# Patient Record
Sex: Male | Born: 1935 | Marital: Married
Health system: Southern US, Community
[De-identification: ages and names within clinical notes are randomized; demographics above are authoritative.]

---

## 2010-12-13 ENCOUNTER — Ambulatory Visit: Payer: Self-pay | Admitting: Podiatry

## 2010-12-16 ENCOUNTER — Ambulatory Visit: Payer: Self-pay | Admitting: Podiatry

## 2010-12-19 LAB — PATHOLOGY REPORT

## 2011-07-26 ENCOUNTER — Ambulatory Visit: Payer: Self-pay | Admitting: Neurology

## 2011-08-01 ENCOUNTER — Ambulatory Visit: Payer: Self-pay | Admitting: Neurology

## 2011-12-26 ENCOUNTER — Ambulatory Visit: Payer: Self-pay | Admitting: Internal Medicine

## 2011-12-26 LAB — URINALYSIS, COMPLETE
Glucose,UR: NEGATIVE mg/dL (ref 0–75)
Ketone: NEGATIVE
Nitrite: NEGATIVE
Ph: 6 (ref 4.5–8.0)
Protein: 100
RBC,UR: >30 /HPF (ref 0–5)
Specific Gravity: >=1.03 (ref 1.003–1.030)

## 2011-12-28 LAB — URINE CULTURE

## 2013-03-25 ENCOUNTER — Ambulatory Visit: Payer: Self-pay | Admitting: Cardiology

## 2013-03-25 LAB — CBC WITH DIFFERENTIAL/PLATELET
Basophil #: 0.1 10*3/uL (ref 0.0–0.1)
Basophil %: 1 %
Eosinophil #: 0.2 10*3/uL (ref 0.0–0.7)
Eosinophil %: 2.1 %
HCT: 41.8 % (ref 40.0–52.0)
Lymphocyte #: 2.7 10*3/uL (ref 1.0–3.6)
MCH: 30.2 pg (ref 26.0–34.0)
MCHC: 33.6 g/dL (ref 32.0–36.0)
MCV: 90 fL (ref 80–100)
Monocyte #: 1.1 x10 3/mm — ABNORMAL HIGH (ref 0.2–1.0)
Monocyte %: 10.9 %
Neutrophil #: 6 10*3/uL (ref 1.4–6.5)
RDW: 14.9 % — ABNORMAL HIGH (ref 11.5–14.5)
WBC: 10.1 10*3/uL (ref 3.8–10.6)

## 2013-03-25 LAB — BASIC METABOLIC PANEL
Anion Gap: 3 — ABNORMAL LOW (ref 7–16)
BUN: 26 mg/dL — ABNORMAL HIGH (ref 7–18)
EGFR (Non-African Amer.): 54 — ABNORMAL LOW
Osmolality: 286 (ref 275–301)
Potassium: 4.4 mmol/L (ref 3.5–5.1)
Sodium: 140 mmol/L (ref 136–145)

## 2013-03-25 LAB — APTT: Activated PTT: 35.9 secs (ref 23.6–35.9)

## 2013-03-25 LAB — PROTIME-INR
INR: 1.2
Prothrombin Time: 14.9 secs — ABNORMAL HIGH (ref 11.5–14.7)

## 2013-04-01 ENCOUNTER — Ambulatory Visit: Payer: Self-pay | Admitting: Cardiology

## 2013-04-01 IMAGING — CR DG CHEST 1V PORT
1 series · 1 of 1 positions shown · non-contrast
Comparison: [DATE]

CLINICAL DATA: Pacemaker placement

EXAM:
PORTABLE CHEST - 1 VIEW

[ap]
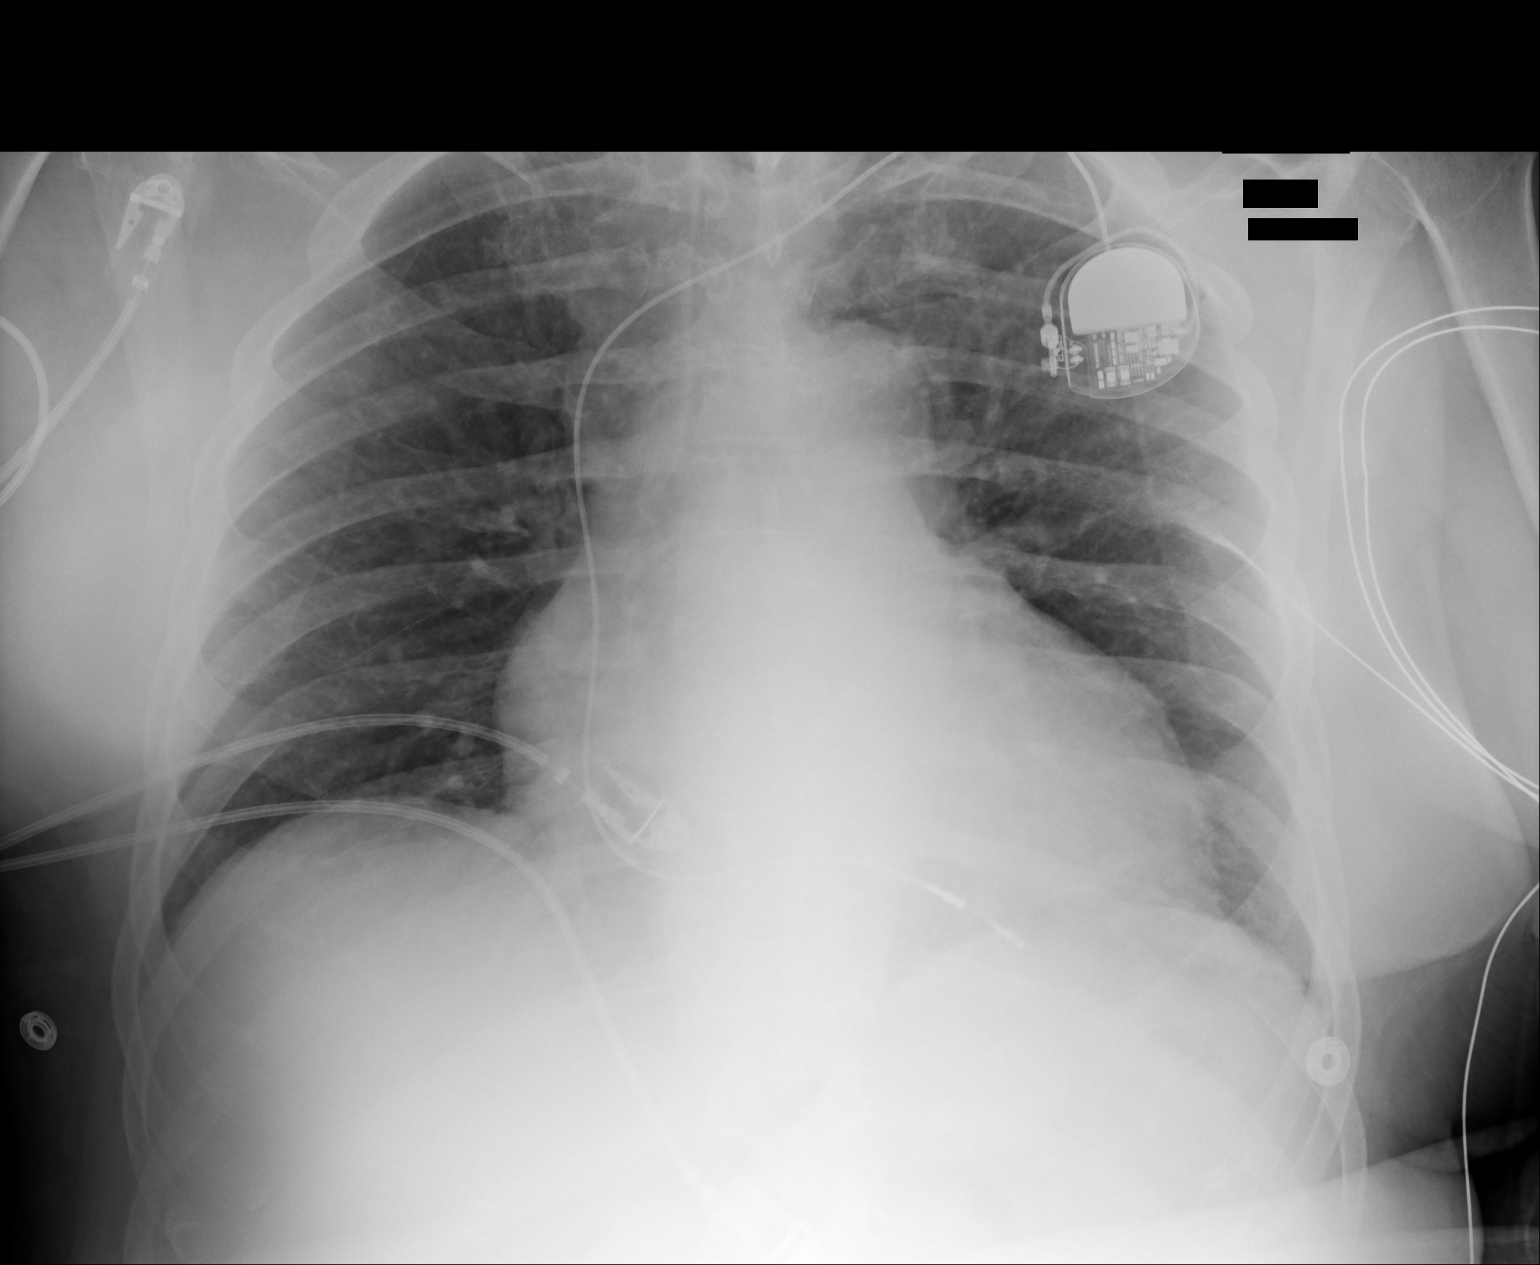

[1 of 1 positions shown; findings below may reference images not displayed]

FINDINGS: Patient is status post cardiac pacemaker placement with single lead
projected over the right ventricle. There is no pneumothorax. The
lungs are clear. The aorta is tortuous. The heart size is enlarged.
The soft tissues and osseous structures are stable.
IMPRESSION: No active disease.  Pacemaker as described.  No pneumothorax.

## 2013-05-16 LAB — URINALYSIS, COMPLETE
Bilirubin,UR: NEGATIVE
Ph: 5 (ref 4.5–8.0)
RBC,UR: 51 /HPF (ref 0–5)
Squamous Epithelial: 1

## 2013-05-16 LAB — COMPREHENSIVE METABOLIC PANEL
Alkaline Phosphatase: 103 U/L
Anion Gap: 5 — ABNORMAL LOW (ref 7–16)
Bilirubin,Total: 0.8 mg/dL (ref 0.2–1.0)
EGFR (African American): 35 — ABNORMAL LOW
Osmolality: 296 (ref 275–301)
Potassium: 5.6 mmol/L — ABNORMAL HIGH (ref 3.5–5.1)
SGOT(AST): 62 U/L — ABNORMAL HIGH (ref 15–37)
Total Protein: 7.8 g/dL (ref 6.4–8.2)

## 2013-05-16 LAB — CBC
HCT: 45.6 % (ref 40.0–52.0)
MCHC: 32.9 g/dL (ref 32.0–36.0)
MCV: 87 fL (ref 80–100)
RDW: 15 % — ABNORMAL HIGH (ref 11.5–14.5)
WBC: 17 10*3/uL — ABNORMAL HIGH (ref 3.8–10.6)

## 2013-05-16 LAB — TROPONIN I: Troponin-I: 0.21 ng/mL — ABNORMAL HIGH

## 2013-05-16 LAB — LIPASE, BLOOD: Lipase: 128 U/L (ref 73–393)

## 2013-05-17 ENCOUNTER — Inpatient Hospital Stay: Payer: Self-pay | Admitting: Internal Medicine

## 2013-05-17 LAB — CBC WITH DIFFERENTIAL/PLATELET
Basophil %: 0.1 %
Eosinophil #: 0 10*3/uL (ref 0.0–0.7)
HCT: 41 % (ref 40.0–52.0)
HGB: 13.4 g/dL (ref 13.0–18.0)
Lymphocyte #: 1.5 10*3/uL (ref 1.0–3.6)
Lymphocyte %: 11.5 %
MCH: 28.3 pg (ref 26.0–34.0)
Neutrophil %: 81.6 %
Platelet: 157 10*3/uL (ref 150–440)
RBC: 4.72 10*6/uL (ref 4.40–5.90)
RDW: 14.9 % — ABNORMAL HIGH (ref 11.5–14.5)
WBC: 12.7 10*3/uL — ABNORMAL HIGH (ref 3.8–10.6)

## 2013-05-17 LAB — BASIC METABOLIC PANEL
BUN: 48 mg/dL — ABNORMAL HIGH (ref 7–18)
Calcium, Total: 8.6 mg/dL (ref 8.5–10.1)
Co2: 30 mmol/L (ref 21–32)
Creatinine: 1.75 mg/dL — ABNORMAL HIGH (ref 0.60–1.30)
Glucose: 248 mg/dL — ABNORMAL HIGH (ref 65–99)
Osmolality: 306 (ref 275–301)
Potassium: 4.7 mmol/L (ref 3.5–5.1)

## 2013-05-17 LAB — TROPONIN I: Troponin-I: 0.29 ng/mL — ABNORMAL HIGH

## 2013-05-17 LAB — HEMOGLOBIN
HGB: 14.5 g/dL (ref 13.0–18.0)
HGB: 14.1 g/dL (ref 13.0–18.0)

## 2013-05-17 LAB — HEMATOCRIT: HCT: 44.5 % (ref 40.0–52.0)

## 2013-05-18 LAB — CBC WITH DIFFERENTIAL/PLATELET
Basophil %: 0.2 %
Eosinophil #: 0 10*3/uL (ref 0.0–0.7)
Eosinophil %: 0 %
Lymphocyte #: 1.6 10*3/uL (ref 1.0–3.6)
MCH: 28.7 pg (ref 26.0–34.0)
MCV: 87 fL (ref 80–100)
Monocyte #: 0.9 x10 3/mm (ref 0.2–1.0)
Monocyte %: 8.2 %
Neutrophil %: 78.2 %
Platelet: 182 10*3/uL (ref 150–440)
RBC: 5.05 10*6/uL (ref 4.40–5.90)
RDW: 15.3 % — ABNORMAL HIGH (ref 11.5–14.5)

## 2013-05-18 LAB — BASIC METABOLIC PANEL
Anion Gap: 8 (ref 7–16)
Chloride: 107 mmol/L (ref 98–107)
Co2: 28 mmol/L (ref 21–32)
Creatinine: 1.75 mg/dL — ABNORMAL HIGH (ref 0.60–1.30)
EGFR (African American): 43 — ABNORMAL LOW
EGFR (Non-African Amer.): 37 — ABNORMAL LOW
Glucose: 168 mg/dL — ABNORMAL HIGH (ref 65–99)
Osmolality: 300 (ref 275–301)
Sodium: 143 mmol/L (ref 136–145)

## 2013-05-18 LAB — PROTIME-INR
INR: 1.2
Prothrombin Time: 14.9 secs — ABNORMAL HIGH (ref 11.5–14.7)

## 2013-05-19 LAB — CBC WITH DIFFERENTIAL/PLATELET
Basophil %: 0.4 %
Eosinophil #: 0 10*3/uL (ref 0.0–0.7)
Eosinophil %: 0.2 %
Lymphocyte #: 2.1 10*3/uL (ref 1.0–3.6)
Lymphocyte %: 18.7 %
MCHC: 32.5 g/dL (ref 32.0–36.0)
MCV: 88 fL (ref 80–100)
Monocyte %: 9.3 %
Neutrophil %: 71.4 %
RDW: 15.1 % — ABNORMAL HIGH (ref 11.5–14.5)

## 2013-05-19 LAB — BASIC METABOLIC PANEL
Anion Gap: 8 (ref 7–16)
Calcium, Total: 7.8 mg/dL — ABNORMAL LOW (ref 8.5–10.1)
Chloride: 111 mmol/L — ABNORMAL HIGH (ref 98–107)
Co2: 26 mmol/L (ref 21–32)
Creatinine: 1.63 mg/dL — ABNORMAL HIGH (ref 0.60–1.30)
EGFR (African American): 47 — ABNORMAL LOW
EGFR (Non-African Amer.): 40 — ABNORMAL LOW
Glucose: 156 mg/dL — ABNORMAL HIGH (ref 65–99)

## 2013-05-20 LAB — COMPREHENSIVE METABOLIC PANEL
Anion Gap: 10 (ref 7–16)
BUN: 36 mg/dL — ABNORMAL HIGH (ref 7–18)
Bilirubin,Total: 0.7 mg/dL (ref 0.2–1.0)
Chloride: 115 mmol/L — ABNORMAL HIGH (ref 98–107)
Creatinine: 1.3 mg/dL (ref 0.60–1.30)
EGFR (African American): >60
EGFR (Non-African Amer.): 53 — ABNORMAL LOW
Glucose: 188 mg/dL — ABNORMAL HIGH (ref 65–99)
Osmolality: 306 (ref 275–301)
Potassium: 3.8 mmol/L (ref 3.5–5.1)
SGOT(AST): 42 U/L — ABNORMAL HIGH (ref 15–37)
Sodium: 147 mmol/L — ABNORMAL HIGH (ref 136–145)

## 2013-05-20 LAB — CBC WITH DIFFERENTIAL/PLATELET
Basophil #: 0.1 10*3/uL (ref 0.0–0.1)
Eosinophil %: 0.6 %
HCT: 45.2 % (ref 40.0–52.0)
Lymphocyte #: 1.6 10*3/uL (ref 1.0–3.6)
Lymphocyte %: 15.9 %
MCH: 28.6 pg (ref 26.0–34.0)
MCHC: 32.3 g/dL (ref 32.0–36.0)
Monocyte %: 9 %

## 2013-05-22 ENCOUNTER — Ambulatory Visit: Payer: Self-pay | Admitting: Internal Medicine

## 2013-05-22 LAB — CULTURE, BLOOD (SINGLE)

## 2013-05-23 DIAGNOSIS — I4891 Unspecified atrial fibrillation: Secondary | ICD-10-CM

## 2013-05-23 LAB — CBC WITH DIFFERENTIAL/PLATELET
BASOS PCT: 0.5 %
Basophil #: 0 10*3/uL (ref 0.0–0.1)
Eosinophil #: 0.1 10*3/uL (ref 0.0–0.7)
Eosinophil %: 1.2 %
HCT: 46.2 % (ref 40.0–52.0)
HGB: 15.1 g/dL (ref 13.0–18.0)
Lymphocyte #: 1.4 10*3/uL (ref 1.0–3.6)
Lymphocyte %: 18.9 %
MCH: 28.5 pg (ref 26.0–34.0)
MCHC: 32.7 g/dL (ref 32.0–36.0)
MCV: 87 fL (ref 80–100)
Monocyte #: 0.7 x10 3/mm (ref 0.2–1.0)
Monocyte %: 9.7 %
NEUTROS ABS: 5.1 10*3/uL (ref 1.4–6.5)
Neutrophil %: 69.7 %
Platelet: 159 10*3/uL (ref 150–440)
RBC: 5.31 10*6/uL (ref 4.40–5.90)
RDW: 15.1 % — AB (ref 11.5–14.5)
WBC: 7.3 10*3/uL (ref 3.8–10.6)

## 2013-05-23 LAB — MAGNESIUM: Magnesium: 2.2 mg/dL

## 2013-05-23 LAB — BASIC METABOLIC PANEL
ANION GAP: 7 (ref 7–16)
BUN: 37 mg/dL — ABNORMAL HIGH (ref 7–18)
CREATININE: 1.69 mg/dL — AB (ref 0.60–1.30)
Calcium, Total: 8.3 mg/dL — ABNORMAL LOW (ref 8.5–10.1)
Chloride: 115 mmol/L — ABNORMAL HIGH (ref 98–107)
Co2: 23 mmol/L (ref 21–32)
EGFR (Non-African Amer.): 39 — ABNORMAL LOW
GFR CALC AF AMER: 45 — AB
GLUCOSE: 225 mg/dL — AB (ref 65–99)
Osmolality: 304 (ref 275–301)
Potassium: 3.8 mmol/L (ref 3.5–5.1)
Sodium: 145 mmol/L (ref 136–145)

## 2013-06-22 ENCOUNTER — Ambulatory Visit: Payer: Self-pay | Admitting: Internal Medicine

## 2014-09-11 NOTE — Consult Note (Signed)
Brief Consult Note: Diagnosis: ileus sec to severe cystitis.   Patient was seen by consultant.   Recommend further assessment or treatment.   Orders entered.   Discussed with Attending MD.   Comments: suspect ileus sec to cystitis doubt sbo. PE without signif findings and pain improving, mostly SP. No prior abd surgery. will repeat films and follow.  Electronic Signatures: Lattie Hawooper, Richard E (MD)  (Signed 27-Dec-14 06:39)  Authored: Brief Consult Note   Last Updated: 27-Dec-14 06:39 by Lattie Hawooper, Richard E (MD)

## 2014-09-11 NOTE — Op Note (Signed)
PATIENT NAME:  Carl MaesBRADSHAW, [name] MR#:  [id number] DATE OF BIRTH:  1935-04-06  DATE OF PROCEDURE:  04/01/2013  PRIMARY CARE PHYSICIAN: Dr. Harrington Challengerhies.  PREPROCEDURE DIAGNOSIS: Sick sinus syndrome.   PROCEDURE: Single chamber pacemaker implantation.   POSTPROCEDURE DIAGNOSIS: Intermittent ventricular pacing.   INDICATION: The patient is a 80 year old gentleman with known history of chronic atrial fibrillation. The patient has had an increased recent history of generalized weakness, fatigue and dizziness. Holter monitor revealed atrial fibrillation with long pauses up to 3.5 seconds.   DESCRIPTION OF PROCEDURE: The risks, benefits, and alternatives of pacemaker implantation were explained to the patient and informed written consent was obtained.   DESCRIPTION OF PROCEDURE: He was brought to the operating room in a fasting state. The left pectoral region was prepped and draped in the usual sterile manner. A 6 cm incision was performed over the left pectoral region. The pacemaker pocket was generated by electrocautery and blunt dissection. Access was obtained to the left subclavian vein by fine needle aspiration. Right ventricular lead was positioned into the right apical intraventricular septum (Medtronic [id number]). The lead was sutured in place after proper thresholds were obtained. The lead was then connected to a single-chamber rate responsive pacemaker generator (Medtronic Adapta [id number]). The pacemaker pocket was irrigated with gentamicin solution. The pacemaker generator was positioned into the pocket. The pocket was closed with 2-0 and 4-0 Vicryl, respectively. Steri-Strips and pressure dressing were applied.   ____________________________ Marcina MillardAlexander Riki Berninger, MD ap:aw D: 04/01/2013 13:15:02 ET T: 04/01/2013 13:58:03 ET JOB#: [id number]  cc: Marcina MillardAlexander Velicia Dejager, MD, <Dictator> Marcina MillardALEXANDER Sherae Santino MD ELECTRONICALLY SIGNED 04/09/2013 9:33

## 2014-09-11 NOTE — Consult Note (Signed)
PATIENT NAME:  Carl MaesBRADSHAW, [name] MR#:  [id number] DATE OF BIRTH:  [birth year]  DATE OF CONSULTATION:  05/17/2013  REFERRING PHYSICIAN:   CONSULTING PHYSICIAN:  Adah Salvageichard E. Excell Seltzerooper, MD  CHIEF COMPLAINT:  Abdominal pain, resolved.   HISTORY OF PRESENT ILLNESS:  This is a patient who has been admitted to the hospital with a diagnosis of acute cystitis who had some episodes of vomiting 24 hours prior to being seen, approximately 18 hours prior to coming to the hospital.  He also had abdominal pain, which is mostly resolved at this point.  He states that it was at 70% and it is much less now, less than 30% and nearly gone.  He has no further nausea or vomiting.  He states he has not passed gas in the day prior to being seen, had a bowel movement which was normal the day before.   The patient is seen early in the morning and he is quite tired, having been in the Emergency Room most of the night.  He does not open his eyes, but answers questions appropriately, but history is difficult to obtain from him.  Some of the history is obtained from the chart.   PAST MEDICAL HISTORY:  Diabetes, hypertension, atrial fibrillation, Parkinson's, hyperlipidemia.   PAST SURGICAL HISTORY:  The patient initially with my discussion denied any abdominal surgery, but apparently via the chart has had a cholecystectomy and a hernia repair and a pacemaker placement.   ALLERGIES:  None.   MEDICATIONS:  Multiple, see chart.   FAMILY HISTORY:  Noncontributory.   SOCIAL HISTORY:  The patient is a nonsmoker.   REVIEW OF SYSTEMS:  Not obtainable with any sort of reliability.   PHYSICAL EXAMINATION: GENERAL:  Healthy, comfortable-appearing, but somnolent male patient.  He does not open his eyes, preferring to keep them closed, but certainly answers questions reasonably.  VITAL SIGNS:  Stable.  He is afebrile.  HEENT:  No scleral icterus.  NECK:  No palpable neck nodes.  CHEST:  Clear to auscultation.  CARDIAC:  Regular  rate and rhythm.  ABDOMEN:  Only slightly distended, nontympanitic, nontender.  There is very minimal tenderness in the suprapubic area without peritoneal signs.  EXTREMITIES:  Without edema.  NEUROLOGIC:  Grossly intact.  INTEGUMENT:  Shows multiple ecchymoses, especially over the hands.   DIAGNOSTIC DATA:  KUB is personally reviewed showing dilated small bowel loops, but gas in the colon and rectum.  No free air.   LABORATORY DATA:  Demonstrate a creatinine of 2.07, a potassium of 5.6, BUN of 48, white blood cell count of 17, hemoglobin and hematocrit 15 and 45.   ASSESSMENT AND PLAN:  This is a patient with obvious cystitis.  He has very dirty urine with bacteria and blood.  He has been started on IV antibiotics.  He was seen in the hospital at which time he states that his abdominal pain is mostly resolved.  He is minimally tender in the suprapubic area without peritoneal signs and no obvious sign of obstruction.  My recommendation would be to repeat films later on today to assess for progression or improvement of what is likely an ileus secondary to severe cystitis.  This was discussed with the patient and with [facility].    ____________________________ Adah Salvageichard E. Excell Seltzerooper, MD rec:ea D: 05/17/2013 20:02:39 ET T: 05/17/2013 23:58:47 ET JOB#: [id number]  cc: Adah Salvageichard E. Excell Seltzerooper, MD, <Dictator> Lattie HawICHARD E COOPER MD ELECTRONICALLY SIGNED 05/18/2013 6:43

## 2014-09-11 NOTE — Consult Note (Signed)
Pt with distended abd, no evidence of signif obstruction.  Pt asleep and not wake up while I examined his abd.  Bowel sounds present, no peritoneal signs seen, cardiac monitor with frequent PVC's and probable at flutter.  No evidence of bleeding, hgb 14.  Agree with plan to treat and hold EGD at this time.  Abd probably ileus due to UTI, Parkinsons also.  Electronic Signatures: Scot JunElliott, Duvall Comes T (MD)  (Signed on 28-Dec-14 12:16)  Authored  Last Updated: 28-Dec-14 12:16 by Scot JunElliott, Evynn Boutelle T (MD)

## 2014-09-11 NOTE — H&P (Signed)
PATIENT NAME:  Jamal MaesBRADSHAW, Jessi E MR#:  [id number] DATE OF BIRTH:  Apr 29, 1935  DATE OF ADMISSION:  05/17/2013  PRIMARY CARE PHYSICIAN:  Dr. Mickey Farberavid Thies.   REFERRING PHYSICIAN:  Dr. Carollee MassedKaminski.   CHIEF COMPLAINT:  Altered mental status, vomiting blood.   HISTORY OF PRESENT ILLNESS:  The patient is a 80 year old Caucasian male with a past medical history of diabetes mellitus, hyperlipidemia, hypertension, chronic atrial fibrillation and Parkinson's disease with underlying dementia is brought into the ER by his wife for altered mental status.  According to the wife at bedside the patient was complaining of abdominal pain since yesterday evening and started vomiting.  Today morning he has vomited dark-colored blood which was approximately one cup in size.  Denies any diarrhea or vomiting.  No fever.  No loss of consciousness.  The patient was found to be more confused and he was brought into the ER.  In the ER, the patient was lethargic and urine was positive for acute cystitis.  The patient was also with atrial fibrillation with RVR.  The patient was given IV fluids and amiodarone.  Potassium was found to be elevated at 5.6.  According to the ER physician he has noticed some wide complex tachycardia.  The patient being lethargic, Kayexalate was ordered as rectal suppository.  Troponin is detectable at 0.2.  During my examination, the patient was opening his eyes, but found to be very lethargic and unable to get much history from the patient.  According to the wife, the patient complained  of abdominal pain yesterday, but denies any today.   PAST MEDICAL HISTORY:  Diabetes mellitus, hyperlipidemia, hypertension, chronic atrial fibrillation, Parkinson's disease, underlying dementia.   PAST SURGICAL HISTORY:  Cholecystectomy, hernia repair, recent history of pacemaker placement one month ago.    ALLERGIES:  The patient has no known drug allergies.   PSYCHOSOCIAL HISTORY:  Lives at home with wife.  No history  of smoking, alcohol or illicit drug usage.   FAMILY HISTORY:  Diabetes runs in his family.   HOME MEDICATIONS:  Vitamin E one tablet by mouth once daily, pravastatin 40 mg once daily, multivitamin once daily, Mirapex 1 mg by mouth 2 times a day, metformin 1 tablet by mouth once strength unknown, lisinopril 20 mg once daily, Lantus 17 units subcutaneously once daily, Klor-Con 2 tablets by mouth 4 times a day, Humalog 13 to 15 units subcutaneously, furosemide 20 mg 2 times a day, Eliquis 5 mg 1 by mouth twice a day, donepezil 5 mg once daily, Keflex 250 mg by mouth q. 6 hours, carbidopa, levodopa 25 mg by mouth 2 times a day, atenolol 25 mg 2 times a day, amlodipine 5 mg once daily.   REVIEW OF SYSTEMS:  Unobtainable as the patient is lethargic.   PHYSICAL EXAMINATION: VITAL SIGNS:  Temperature 99.3, pulse initially 140, but went down to 96, respirations 18 to 20, blood pressure 150/86, pulse ox 98% on room air.  GENERAL APPEARANCE:  Not under acute distress.  Moderately built and nourished.  Arousable to verbal commands, but lethargic.  HEENT:  Normocephalic, atraumatic.  Pupils are equally reacting to light and accommodation.  No scleral icterus.  No conjunctival injection.  No sinus tenderness, somewhat dry mucous membranes.  NECK:  Supple.  No JVD.  No thyromegaly.  The patient is spontaneously turning his neck during my examination.  LUNGS:  Clear to auscultation bilaterally.  No accessory muscle usage.  No anterior chest wall tenderness on palpation.  CARDIOVASCULAR:  Irregularly irregular.  No murmurs.  GASTROINTESTINAL:  Soft, obese.  Bowel sounds are positive in all four quadrants.  Nontender, nondistended.  No masses felt.  No hepatosplenomegaly.  NEUROLOGIC:  Arousable, but lethargic.  Unable to examine cranial nerves.  Motor seemed to be intact as the patient is moving his extremities spontaneously.  Sensory could not elicit.  Reflexes are 2+.  EXTREMITIES:  2+ pitting edema is present.   No cyanosis.  No clubbing.  Peripheral pulses are 2+.  PSYCHIATRIC:  Could not be elicited in view of altered mental status.  MUSCULOSKELETAL:  No joint effusion, tenderness, erythema.   LABORATORY AND IMAGING STUDIES:  Urinalysis yellow in color, turbid in appearance, ketones trace, nitrites negative, leukocyte esterase 3+.  The patient's blood group is A+, antibody negative.  WBC 17,000, hemoglobin 15.0, hematocrit 45.6, platelets 168.  Troponin 0.21.  LFTs are normal except AST which is related at 62.  Glucose 243, BUN 48, creatinine 2.77, sodium 138, potassium 5.6, chloride 103, CO2 30.  GFR 30, anion gap 5.  Serum osmolality 296, calcium 9.7, lipase 128.  Abdominal x-ray is pending.  Chest x-ray, cardiomegaly and mild bibasilar atelectasis.  A 12-lead EKG has revealed A-Fib with RVR at 139 beats per minute, also wide complex tachycardia, left axis deviation.   ASSESSMENT AND PLAN:  A 80 year old Caucasian male brought into the ER for altered mental status will be admitted with the following assessment and plan:  1.  Altered mental status, could be from dehydration from vomiting which could be prerenal, etiology/sepsis from acute cystitis.  We will admit him to tele bed.  Pan cultures were ordered.  We will start him on IV Rocephin empirically and provide IV fluids.  The patient will be kept nothing by mouth in view of altered mental status.  2.  Hematemesis.  He is noted to be nothing by mouth.  We will provide PPI.  GI consult is placed.  Abdominal x-ray is pending.  Monitor hemoglobin and hematocrit q. 8 hours.  3.  Atrial fibrillation with rapid ventricular response with a detectable troponin.  The detectable troponin could be from demand ischemia from atrial fibrillation with rapid ventricular response.  The patient will be on telemetry.  We will provide him Cardizem as needed basis.  Cardiology consult is placed to Dr. Gwen Pounds.  4.  Diabetes mellitus.  The patient being nothing by mouth, will be  on insulin sliding scale.  5.  Hyperkalemia with wide complex tachycardia.  The patient was given amiodarone.  Kayexalate was given.  We will continue close monitoring.  6.  Chronic history of Parkinson's disease with underlying dementia.  We will hold off on his home medications of in view of lethargy and altered mentation.   7.  We will provide him gastrointestinal prophylaxis.   8.  Deep vein thrombosis prophylaxis with sequential compression devices.  We will hold off on Eliquis in view of hematemesis.  9.  HE IS FULL CODE.  Wife is medical power of attorney.   Diagnosis and plan of care was discussed in detail with the patient's wife at bedside.  She verbalized understanding of the plan.   Critical care time spent is 50 minutes.    ____________________________ Ramonita Lab, MD ag:ea D: 05/17/2013 02:22:28 ET T: 05/17/2013 06:01:49 ET JOB#: [id number]  cc: Ramonita Lab, MD, <Dictator> Lamar Blinks, MD Neomia Dear. Harrington Challenger, MD  Ramonita Lab MD ELECTRONICALLY SIGNED 05/21/2013 2:56

## 2014-09-11 NOTE — Consult Note (Signed)
Pt abd feels a little less tight today, bowel sounds good, no BM,  small emesis once, x-ray yesterday morning showed slight more SB distention, still some gastric dilatation.  Pt with at fib flutter on oral meds now for this.  Will try clear liq and see how he does.  Since no BM will give Dulcolax supp.  Discussed with son his problems and will likely need nursing home rehab when he is discharged.  May be time to restart his Parkinsons meds.  Hopefull with treatment of UTI and restart Parkinsons med willl see improvement in GI function.   I will be off for a few days and Dr. Servando SnareWohl will cover and then Dr. Marva PandaSkulskie will cover.  Electronic Signatures: Scot JunElliott, Rithwik Schmieg T (MD)  (Signed on 31-Dec-14 10:29)  Authored  Last Updated: 31-Dec-14 10:29 by Scot JunElliott, Lailynn Southgate T (MD)

## 2014-09-11 NOTE — Consult Note (Signed)
CC: vomiting, did this once during night, abd feels a little less distended/tight, bowel sounds better today, 178/83, P 116 irreg irreg at fib/flutter, afebrile, US of abd neg for renal problems.  Will repeat flat plate abd this morning.  Electronic Signatures: Scot JunElliott, Jaydah Stahle T (MD)  (Signed on 30-Dec-14 07:14)  Authored  Last Updated: 30-Dec-14 07:14 by Scot JunElliott, Kailiana Granquist T (MD)

## 2014-09-11 NOTE — Consult Note (Signed)
Brief Consult Note: Diagnosis: coffee ground emesis.   Patient was seen by consultant.   Consult note dictated.   Comments: I [facility] the bleeding [facility].   Given the possible SBO and dementia, age, comorbidities, normal and stable Hgb,  the risk of doing EGD would outweight the benefit.   Recommend monitoring of Hgb and cont Protonix 40 IV q12.  Suspect he will not have further bleeding.   Defer to Dr Michela PitcherEly regarding managment of the possible SBO.  Electronic Signatures: Dow Adolphein, Matthew (MD)  (Signed 27-Dec-14 17:12)  Authored: Brief Consult Note   Last Updated: 27-Dec-14 17:12 by Dow Adolphein, Matthew (MD)

## 2014-09-11 NOTE — Consult Note (Signed)
Pt disoriented, some emesis with lying down, X-ray shows probable ileus.  Abd with mild distention, bowel sounds present, less feeling of nausea while sitting up.  VSs, tmax 99.5, Abd film shows some gastric distention.  Holding on NG tube but may be needed.  Will get repeat film in morning tomorrow.  Electronic Signatures: Scot JunElliott, Jazae Gandolfi T (MD)  (Signed on 29-Dec-14 08:10)  Authored  Last Updated: 29-Dec-14 08:10 by Scot JunElliott, Jamesmichael Shadd T (MD)

## 2014-09-12 NOTE — Consult Note (Signed)
Chief Complaint:  Subjective/Chief Complaint Pt denies abdominal pain.  Denies nausea or vomiting.  Pt had #2 bms IN PAST 24 HRS.  Denies rectal bleeding or melena.  Appetite is good.   VITAL SIGNS/ANCILLARY NOTES: **Vital Signs.:   01-Jan-15 04:50  Respirations Respirations 16    08:04  Temperature Temperature (F) 98.9  Celsius 37.1  Pulse Pulse 99  Respirations Respirations 18  Systolic BP Systolic BP 157  Diastolic BP (mmHg) Diastolic BP (mmHg) 82  Mean BP 107  Pulse Ox % Pulse Ox % 95  Pulse Ox Activity Level  At rest  Oxygen Delivery Room Air/ 21 %   Brief Assessment:  GEN well developed, well nourished, no acute distress, A/Ox3.   Cardiac Irregular   Respiratory normal resp effort   Gastrointestinal Normal   Gastrointestinal details normal Soft  Nontender  Nondistended  Bowel sounds normal  No rebound tenderness  No gaurding   EXTR negative cyanosis/clubbing, negative edema   Additional Physical Exam Skin: warm, dry, intact   Radiology Results: XRay:    30-Dec-14 06:38, Abdomen AP Only  Abdomen AP Only   REASON FOR EXAM:    ileus follow up  COMMENTS:   Bedside (portable):Y    PROCEDURE: DXR - DXR ABDOMEN AP ONLY  - May 20 2013  6:38AM     CLINICAL DATA:  Abdominal pain.  Ileus.    EXAM:  ABDOMEN - 1 VIEW    COMPARISON:  05/19/2013    FINDINGS:  Thereis persistent gaseous distention of stomach. Mild increase in  degree of small bowel dilatation is seen within the upper and mid  abdomen. There is persistent scattered gas throughout the colon,  which is nondilated. Surgical clips are seen in the right upper  quadrant from prior cholecystectomy.     IMPRESSION:  Mild increase in small bowel dilatation. Persistent gastric  distention. Partial small bowel obstruction cannot be excluded.      Electronically Signed    By: Myles RosenthalJohn  Stahl M.D.    On: 05/20/2013 08:36         Verified By: Danae OrleansJOHN A. STAHL, M.D.,   Assessment/Plan:  Assessment/Plan:   Assessment Ileus:  Improved.  Secondary to Parkinson's/UTI.   Plan 1) Continue to advance diet as tolerated 2) Continue supportive measures Please call if you have any questions or concerns   Electronic Signatures: Joselyn ArrowJones, Wilberta Dorvil L (NP)  (Signed 01-Jan-15 10:45)  Authored: Chief Complaint, VITAL SIGNS/ANCILLARY NOTES, Brief Assessment, Radiology Results, Assessment/Plan   Last Updated: 01-Jan-15 10:45 by Joselyn ArrowJones, Abrar Bilton L (NP)

## 2014-09-12 NOTE — Consult Note (Signed)
Chief Complaint:  Subjective/Chief Complaint Pt denies abdominal pain.  Some nausea, 1 emesis last night without blood.  No BM in past 24 HRS.  Denies rectal bleeding or melena.  Appetite is good.  Pt had episode of AFib w/ RVR.   VITAL SIGNS/ANCILLARY NOTES: **Vital Signs.:   02-Jan-15 07:41  Temperature Temperature (F) 98.9  Celsius 37.1  Temperature Source oral  Pulse Pulse 162  Respirations Respirations 18  Systolic BP Systolic BP 998  Diastolic BP (mmHg) Diastolic BP (mmHg) 87  Mean BP 100  Pulse Ox % Pulse Ox % 95  Pulse Ox Activity Level  At rest  Oxygen Delivery Room Air/ 21 %    09:33  Pulse Pulse 65  Systolic BP Systolic BP 721  Diastolic BP (mmHg) Diastolic BP (mmHg) 65  Mean BP 83   Brief Assessment:  GEN well developed, well nourished, no acute distress, A/Ox3.   Cardiac Irregular   Respiratory normal resp effort   Gastrointestinal Normal   Gastrointestinal details normal Soft  Nontender  Nondistended  Bowel sounds normal  No rebound tenderness  No gaurding   EXTR negative cyanosis/clubbing, negative edema   Additional Physical Exam Skin: warm, dry, intact   Lab Results: Routine Chem:  02-Jan-15 08:59   Glucose, Serum  225  BUN  37  Creatinine (comp)  1.69  Potassium, Serum 3.8  Chloride, Serum  115  CO2, Serum 23  Calcium (Total), Serum  8.3  Anion Gap 7  Osmolality (calc) 304  eGFR (African American)  45  eGFR (Non-African American)  39 (eGFR values <1m/min/1.73 m2 may be an indication of chronic kidney disease (CKD). Calculated eGFR is useful in patients with stable renal function. The eGFR calculation will not be reliable in acutely ill patients when serum creatinine is changing rapidly. It is not useful in  patients on dialysis. The eGFR calculation may not be applicable to patients at the low and high extremes of body sizes, pregnant women, and vegetarians.)  Magnesium, Serum 2.2 (1.8-2.4 THERAPEUTIC RANGE: 4-7 mg/dL TOXIC: > 10  mg/dL  -----------------------)  Routine Hem:  02-Jan-15 08:59   WBC (CBC) 7.3  RBC (CBC) 5.31  Hemoglobin (CBC) 15.1  Hematocrit (CBC) 46.2  Platelet Count (CBC) 159  MCV 87  MCH 28.5  MCHC 32.7  RDW  15.1  Neutrophil % 69.7  Lymphocyte % 18.9  Monocyte % 9.7  Eosinophil % 1.2  Basophil % 0.5  Neutrophil # 5.1  Lymphocyte # 1.4  Monocyte # 0.7  Eosinophil # 0.1  Basophil # 0.0 (Result(s) reported on 23 May 2013 at 09:23AM.)   Assessment/Plan:  Assessment/Plan:  Assessment Ileus:  Improved. Pt had 1 episode of emesis in past 24 hrs, but otherwise tolerating liquids well.  No hematemesis.  Pt had episode AFib with RVR & is being followed with cardiology.   Plan 1) Continue supportive measures 2) Continue PPI Dr SGustavo Lahwill resume care over the weekend.  Please call if you have any questions or concerns.   Electronic Signatures: JAndria Meuse(NP)  (Signed 02-Jan-15 10:19)  Authored: Chief Complaint, VITAL SIGNS/ANCILLARY NOTES, Brief Assessment, Lab Results, Assessment/Plan   Last Updated: 02-Jan-15 10:19 by JAndria Meuse(NP)

## 2014-09-12 NOTE — Consult Note (Signed)
PATIENT NAME:  Carl Wagner, Carl Wagner MR#:  [id number] DATE OF BIRTH:  November 17, 1934  DATE OF CONSULTATION:  05/17/2013  CONSULTING PHYSICIAN:  Dow Adolph, MD  REFERRING PHYSICIAN:  Dr. Deanna Artis Gouru  REASON FOR CONSULT: Hematemesis.   HISTORY OF PRESENT ILLNESS: Mr. Carl Wagner is a 80 year old male with a history of dementia, chronic AFib, Parkinson's disease, who is brought to the Emergency Room for evaluation of altered mental status, abdominal pain, hematemesis. The patient is unable to give much of a history, so this history is per the wife. According to the wife, yesterday the patient was complaining of pain in his belly, and also had some vomiting of some nonbloody, nonbilious material. Yesterday morning, he then woke up and vomited some very dark material. Based on this, he was brought to the to the Emergency Room. He has not thrown up any more dark material since yesterday morning.   In addition, [name] has been noted to have a KUB consistent with a possible small bowel obstruction. He has been consulted on by the surgical service. Here in the hospital, his hemoglobins have been normal and stable.   PAST MEDICAL HISTORY: 1.  DM2.  2.  Hyperlipidemia.  3.  Hypertension.  4.  Chronic AFib.  5.  Parkinson's disease.  6.  Dementia.   PAST SURGICAL HISTORY: Cholecystectomy, hernia repair, status post pacemaker.   ALLERGIES: No known drug allergies.  SOCIAL HISTORY: He lives at home with his wife. He is demented. No alcohol or drug use.   FAMILY HISTORY: No family history of GI malignancy.   HOME MEDICATIONS:  1.  Pravastatin 40 mg daily. 2.  Mirapex 1 mg twice a day.  3.  Metformin once a day.  4.  Lisinopril 20 mg daily.  5.  Lantus 17 units subcu.  6.  Humalog 15 units subcutaneous.  7.  Lasix 20 mg b.i.d.  8.  Donepezil 5 mg daily.  9.  Carbidopa.  10.  Levodopa 25 mg b.i.d.  11.  Atenolol 25 mg b.i.d.  12.  Amlodipine 5 mg daily.  REVIEW OF SYSTEMS:  Per the wife, the  review of systems is negative, except as stated in the HPI. I am unable to obtain an HPI from the patient due to his dementia.   PHYSICAL EXAMINATION: VITAL SIGNS: Temperature is 97.2, pulse is 107, respirations are 17, blood pressure is 151/90, pulse ox is 95% on room air.  GENERAL: A chronically ill-appearing male, elderly. He is alert and oriented x 2 only. HEENT: Normocephalic/atraumatic. Extraocular movements are intact. Anicteric. NECK: Soft, supple. JVP appears normal. No adenopathy. CHEST: Clear to auscultation. No wheeze or crackle. Respirations unlabored. HEART: Regular. No murmur, rub, or gallop.  Normal S1 and S2. ABDOMEN: Mild diffuse tenderness to palpation. High-pitched bowel sounds. No rebound or guarding whatsoever. EXTREMITIES: No swelling, well perfused. SKIN: No rash or lesion. Skin color, texture, turgor normal. NEUROLOGICAL: Grossly intact. PSYCHIATRIC: Normal tone and affect. MUSCULOSKELETAL: No joint swelling or erythema.   DATA: Sodium 143, potassium 4.7, creatinine 1.75, BUN 48. Liver enzymes are normal, except for an AST of 62. Cardiac enzymes are positive, with a CK-MB of 11.6. Troponin of 0.25. White count is 12.7, hemoglobin is 13, hematocrit is 41, platelets are 157. Imaging: Multiple distended and fluid-filled loops of bowel concerning for a small bowel obstruction.   ASSESSMENT AND PLAN Hematemesis: It seems as though this episode did happen after several prior episodes of vomiting. This also seems to have happened in the setting  of a possible small bowel obstruction. I suspect this was most likely a Mallory-Weiss tear. In addition, his hemoglobin has been normal and stable while in the hospital, and he has not had any further episodes of hematemesis.   Given his age, comorbidities, and also the underlying possible small bowel obstruction, I do think the risk of an upper endoscopy would outweigh the benefits at this time. Therefore, I will not plan to perform an  upper endoscopy.   In the meantime, other than watching his hemoglobin and continuing him on a q. 12 PPI, I  would not carry out any further testing in terms of his hematemesis.    Certainly if he were to drop his hemoglobin significantly, or develop active evidence of GI bleed, then we would reconsider at that time.   No additional recommendations at this time. Thank you for this consult.    ____________________________ Dow AdolphMatthew Stephanieann Popescu, MD mr:mr D: 05/17/2013 16:56:00 ET T: 05/17/2013 17:55:31 ET JOB#: [id number]  cc: Dow AdolphMatthew Graycen Degan, MD, <Dictator> Kathalene FramesMATTHEW G Concetta Guion MD ELECTRONICALLY SIGNED 05/25/2013 17:02

## 2014-09-12 NOTE — Consult Note (Signed)
PATIENT NAME:  Carl MaesBRADSHAW, Fredick E MR#:  [id number] DATE OF BIRTH:  1935/01/29  DATE OF CONSULTATION:  05/17/2013  CONSULTING PHYSICIAN:  Keevan Wolz D. Charlese Gruetzmacher, MD  INDICATION: Altered mental status and atrial fibrillation.  PRIMARY CARE PHYSICIAN:  Dr. Harrington Challengerhies.   HISTORY OF PRESENT ILLNESS: Mr. Ermalinda MemosBradshaw is a 80 year old white male with past  medical problems of diabetes, hyperlipidemia, hypertension, chronic atrial fibrillation, Parkinson's disease, dementia, brought to the ER by his wife with altered mental status.  According to his wife, the patient was complaining of abdominal pain with some vomiting. The  patient vomited dark colored, what looked like blood. There was no evidence of diarrhea or fevers.  He was found to be confused with underlying dementia. He was lethargic.  Urine was positive for cystitis. The patient has had chronic atrial fibrillation, rapid ventricular response. He was given IV amiodarone. Potassium was found to be elevated. He had what looked like a wide complex tachycardia with lethargy. He was given Kayexalate. Troponin was relatively low at 0.2. During his examination, the patient was found to be very lethargic which is unusual for him. History is very difficult to obtain, so he was admitted with tachycardia and altered mental status.   PAST MEDICAL HISTORY: Diabetes, hyperlipidemia, hypertension, atrial fibrillation, Parkinson's disease, dementia.   PAST SURGICAL HISTORY: Cholecystectomy, hernia repair, pacemaker placement.   ALLERGIES: No allergies.    SOCIAL HISTORY: Lives with his wife. No smoking or alcohol consumption.   FAMILY HISTORY: Dementia.   MEDICATIONS: Vitamin E once a day, Pravachol 40 a day, multivitamin once a day, Mirapex 1 mg by mouth 2 times a day, metformin 1 tablet twice a day, lisinopril 20 a day, Lantus 17 units once a day, Klor-Con 2 tablets by mouth 4 times a day, Humalog  subcutaneously as needed, Lasix 20 mg twice a day, Eliquis 5 mg twice a  day, Keflex 250 q. 6 hours, Sinemet 25 mg 2 times a day, atenolol 25 two times a day, amlodipine 5 mg once a day.  REVIEW OF SYSTEMS: Unobtainable.   PHYSICAL EXAMINATION:  VITAL SIGNS:  Blood pressure 150/80, pulse of around 110 and irregular, respiratory rate 16, afebrile.  HEENT: Normocephalic, atraumatic. Pupils equal and reactive to light.  NECK: Supple. No significant JVD, bruits, or adenopathy.  LUNGS: Exam was clear to auscultation and percussion. No significant wheeze, rhonchi, or rales.  HEART: Irregularly irregular, tachycardic. No significant murmur, gallops, or rubs.  ABDOMEN: Benign.  EXTREMITY: Within normal limits.  NEUROLOGIC: Difficult to assess with lethargy and decreased mental status. Known dementia.   LABORATORY DATA: Urine 3+ leukocyte esterase. White count 17, hemoglobin 15, hematocrit 45, platelets 168. Troponin 0.21. LFTs normal. BUN 48, creatinine 2.77, sodium 138, potassium 5.6, chloride 103, lipase 128, calcium 9.7.  Chest x-ray: Cardiomegaly, mild bibasilar atelectasis. EKG: Rapid atrial fibrillation, rate of 140 with wide complex, nonspecific ST-T  changes.   ASSESSMENT: 1.  Rapid atrial fibrillation.  2.  Altered mental status. 3.  Hematemesis.   4.  Diabetes.  5.  Hyperkalemia.  6.  Parkinson disease. 7.  Abdominal pain. 8.  Possible urinary tract infection.   PLAN: Agree with admit. Continue to rule out for myocardial infarction because of elevated borderline troponins. Rate control either with amiodarone and/or Cardizem. The patient is not able to tolerate p.o. right now so we are giving IV. If his rates become more difficult, we will add low-dose of digoxin. He has renal insufficiency so we need to be careful. The white  count,  unclear etiology. We will probably add amiodarone therapy as necessary. For diabetes, continue current medications. Will treat hyperkalemia and discontinue supplemental dosages. Parkinson disease. Continue Sinemet. Consider  echocardiogram. He has a permanent pacemaker. We will try to control his rate a little bit better since he has a pacemaker in and treat the patient medically for now.    ____________________________ Bobbie Stack. Juliann Pares, MD ddc:dp D: 05/20/2013 11:42:50 ET T: 05/20/2013 11:58:05 ET JOB#: [id number]  cc: Aaliyah Cancro D. Juliann Pares, MD, <Dictator> Alwyn Pea MD ELECTRONICALLY SIGNED 06/23/2013 9:41

## 2014-09-12 NOTE — Discharge Summary (Signed)
PATIENT NAME:  Carl MaesBRADSHAW, Aaro E MR#:  [id number] DATE OF BIRTH:  1934-07-08  DATE OF ADMISSION:  05/17/2013 DATE OF DISCHARGE:  05/23/2013  DISCHARGE DIAGNOSES:  1. Progressive Parkinson's disease.  2. Metabolic encephalopathy.  3. Atrial fibrillation with rapid ventricular response.  4. Sepsis due to urinary tract infection.  5. Hematemesis.  6. Ileus.  7. Diabetes mellitus.  8. Hyperkalemia with wide-complex tachycardia.  9. Acute renal failure.   SECONDARY DIAGNOSES:  1. Diabetes mellitus.  2. Hyperlipidemia.  3. Hypertension.  4. Chronic atrial fibrillation.  5. Underlying dementia.   CONSULTATIONS:  1. Cardiology, Dr. Juliann Paresallwood.   2. Surgery, Dr. Michela PitcherEly.   3. GI, Dr. Shelle Ironein.  4. Palliative care, Dr. Harvie JuniorPhifer.   PROCEDURES AND RADIOLOGY:   1. Chest x-ray on the 27th of December showed cardiomegaly and mild bibasilar atelectasis.  2. Abdomen, flat and erect, on the 27th of December showed possible small bowel obstruction. Residual stool noted in the colon. No free intra-abdominal air.  3. Abdomen, flat and erect, on the 27th of December at 1420 showed no marked change in the bowel gas pattern concerning for small bowel obstruction.  4. CT scan of the abdomen and pelvis without contrast on the 27th of December showed diffuse distention of a small bowel lobe to 4.1 cm in the maximum diameter, with gradual dilution of contrast in the small bowel. Associated mesenteric edema. Fecal material present from mid to distal ileum with somewhat decompressed loops distally. Small amount of fluid within the bladder. Trace bilateral pleural effusion. Trace pericardial effusion. Mild scattered diverticulosis along the descending colon. Scattered calcification along the abdominal aorta and its branches.  5. Abdominal x-ray on the 28th of December showed ileus. Mild gaseous distention of the stomach.  6. Abdominal x-ray on the 29th of December showed stable gastric distention. No evidence of bowel  obstruction or ileus.  7. Abdominal x-ray on the 30th of December showed mild increase in the small bowel dilatation. Persistent gastric distention. Partial small bowel obstruction cannot be excluded.  8. Bilateral kidney ultrasound on the 29th of December showed small bilateral pleural effusion. No renal abnormalities.   MAJOR LABORATORY PANEL: UA on admission showed 3+ bacteria, 692 WBCs, 3+ leuk esterase. WBC in clumps present. Urine culture grew more than 100,000 colonies of Citrobacter.   Blood cultures x 2 were negative.   HISTORY AND SHORT HOSPITAL COURSE: The patient is a 80 year old male with above-mentioned medical problems who was admitted for altered mental status worrisome for sepsis due to urinary infection. The patient was also found to have hematemesis. Please see Dr. Rob HickmanGouru's dictated history and physical for further details. Surgical consultation was obtained with Dr. Dionne Miloichard Cooper who felt the patient had ileus. Recommended abdominal CT scan which confirmed the same, likely secondary to sepsis. GI consultation was obtained with Dr. Shelle Ironein due to coffee-ground emesis. He recommended conservative management considering advanced age, dementia and co-morbidities. The patient was started on IV PPI. Cardiology consultation was obtained with Dr. Juliann Paresallwood due to atrial fibrillation and elevated borderline troponin which was thought to be due to supply demand ischemia. The patient's rate was controlled with rate-controlling medication. The patient was having trouble keeping food or liquid down which was thought to be due to ongoing ileus, and surgery was following throughout the course. The patient at some point was started on a clear liquid diet on the 31st of December. He tolerated it somewhat but still was having some vomiting. He remained very weak and tremulous, likely  secondary to his Parkinson's disease and now with poor oral nutritional status. He also had some episodes of rapid atrial  fibrillation while in the hospital which at times was difficult to control. After a long discussion with the family by palliative care and me, the decision was made to make him comfort care only and transfer him to hospice home due to his overall complex medical problems and worsening clinical condition. The family was in agreement, and the patient was discharged to hospice home in fair condition.   VITAL SIGNS: On the date of discharge, his vital signs were as follows: Temperature 101.6, heart rate 85 per minute, respirations 18 per minute, blood pressure 154/80 mmHg. He was saturating 93% on room air.   PERTINENT PHYSICAL EXAMINATION ON THE DATE OF DISCHARGE:  CARDIOVASCULAR: Irregularly irregular heart sounds. No murmur, rub or gallop.  ABDOMEN: Soft, hypoactive bowel sounds.  LUNGS: Decreased breath sounds at the bases. No wheezing, rales, rhonchi or crepitation.  NEUROLOGIC: The patient would answer a few questions, but overall very poorly responsive. Would follow minimal commands. He was quite lethargic and was oriented to person and place only. Overall, he looked quite worsened. He was also tremulous.   All other physical examination remained at baseline.   DISCHARGE MEDICATIONS:  1. Mirapex 1 g p.o. twice daily.  2. Atenolol 25 mg p.o. b.i.d.  3. Carbidopa/levodopa 25/100 mg 1 tablet p.o. b.i.d.  4. Morphine 20 mg/mL 0.5 mL to 1 mL orally every 1 to 2 hours as needed.  5. Lorazepam 0.5 mg 1 to 2 tablets p.o./sublingual every 2 to 4 hours as needed.   DISCHARGE DIET: Regular as tolerated.   DISCHARGE ACTIVITY: As tolerated.   DISCHARGE INSTRUCTIONS AND FOLLOWUP: The patient was instructed to follow up with his hospice physician while at the hospice home. He will get medications crushed or liquid when appropriate. May change to rectal route if unable to swallow. He will get 2 liters of oxygen via nasal cannula continuous and as needed. Foley catheter to be left indwelling for urinary  incontinence/prevention of skin breakdown.   TOTAL TIME DISCHARGING THIS PATIENT: 55 minutes.   ____________________________ Ellamae Sia. Sherryll Burger, MD vss:gb D: 05/25/2013 16:38:51 ET T: 05/26/2013 00:06:16 ET JOB#: [id number]  cc: Elandra Powell S. Sherryll Burger, MD, <Dictator> Neomia Dear. Harrington Challenger, MD Dwayne D. Juliann Pares, MD Dow Adolph, MD Carmie End, MD Ned Grace, MD  Ellamae Sia [facility] [place] MD ELECTRONICALLY SIGNED 05/27/2013 10:52
